# Patient Record
Sex: Male | Born: 1980 | Race: Black or African American | Hispanic: No | Marital: Single | Smoking: Current every day smoker
Health system: Southern US, Community
[De-identification: ages and names within clinical notes are randomized; demographics above are authoritative.]

---

## 2017-09-18 ENCOUNTER — Encounter (HOSPITAL_BASED_OUTPATIENT_CLINIC_OR_DEPARTMENT_OTHER): Payer: Self-pay | Admitting: Emergency Medicine

## 2017-09-18 ENCOUNTER — Other Ambulatory Visit: Payer: Self-pay

## 2017-09-18 ENCOUNTER — Emergency Department (HOSPITAL_BASED_OUTPATIENT_CLINIC_OR_DEPARTMENT_OTHER)
Admission: EM | Admit: 2017-09-18 | Discharge: 2017-09-18 | Disposition: A | Discharge status: Home / Self Care | Payer: Managed Care, Other (non HMO) | Attending: Emergency Medicine | Admitting: Emergency Medicine

## 2017-09-18 ENCOUNTER — Emergency Department (HOSPITAL_BASED_OUTPATIENT_CLINIC_OR_DEPARTMENT_OTHER): Payer: Managed Care, Other (non HMO)

## 2017-09-18 DIAGNOSIS — N50811 Right testicular pain: Secondary | ICD-10-CM | POA: Diagnosis not present

## 2017-09-18 DIAGNOSIS — F17200 Nicotine dependence, unspecified, uncomplicated: Secondary | ICD-10-CM | POA: Diagnosis not present

## 2017-09-18 DIAGNOSIS — N50819 Testicular pain, unspecified: Secondary | ICD-10-CM

## 2017-09-18 LAB — URINALYSIS, ROUTINE W REFLEX MICROSCOPIC
BILIRUBIN URINE: NEGATIVE
GLUCOSE, UA: NEGATIVE mg/dL
KETONES UR: 15 mg/dL — AB
LEUKOCYTES UA: NEGATIVE
Nitrite: NEGATIVE
PH: 6 (ref 5.0–8.0)
Protein, ur: NEGATIVE mg/dL
Specific Gravity, Urine: >1.03 — ABNORMAL HIGH (ref 1.005–1.030)

## 2017-09-18 LAB — URINALYSIS, MICROSCOPIC (REFLEX)

## 2017-09-18 LAB — CBG MONITORING, ED: Glucose-Capillary: 79 mg/dL (ref 65–99)

## 2017-09-18 NOTE — ED Provider Notes (Signed)
[facility] EMERGENCY DEPARTMENT Provider Note   CSN: [id number] Arrival date & time: 09/18/17  1747     History   Chief Complaint Chief Complaint  Patient presents with  . Groin Pain    HPI Gary Valdez is a 36 y.o. male.  36 yo M with a chief complaint of right testicle pain.  This has resolved.  Lasted for about 8 hours.  Denies nausea or vomiting.  Described the pain is mild.  Points to an area just above his testicle as the area of pain.  Has had some right side pain over the past couple days.  This is episodic.  Seems to have been worse when he tries to get up out of bed.  Denies trauma.  Denies fevers or chills.  Denies nausea or vomiting.   The history is provided by the patient.  Groin Pain  This is a new problem. The current episode started yesterday. The problem occurs constantly. The problem has not changed since onset.Pertinent negatives include no chest pain, no abdominal pain, no headaches and no shortness of breath. Nothing aggravates the symptoms. Nothing relieves the symptoms. He has tried nothing for the symptoms. The treatment provided no relief.    History reviewed. No pertinent past medical history.  There are no active problems to display for this patient.   History reviewed. No pertinent surgical history.     Home Medications    Prior to Admission medications   Not on File    Family History History reviewed. No pertinent family history.  Social History Social History   Tobacco Use  . Smoking status: Current Every Day Smoker  . Smokeless tobacco: Never Used  Substance Use Topics  . Alcohol use: No    Frequency: Never  . Drug use: No     Allergies   Patient has no known allergies.   Review of Systems Review of Systems  Constitutional: Negative for chills and fever.  HENT: Negative for congestion and facial swelling.   Eyes: Negative for discharge and visual disturbance.  Respiratory: Negative for shortness of  breath.   Cardiovascular: Negative for chest pain and palpitations.  Gastrointestinal: Negative for abdominal pain, diarrhea and vomiting.  Genitourinary: Positive for flank pain and hematuria.  Musculoskeletal: Negative for arthralgias and myalgias.  Skin: Negative for color change and rash.  Neurological: Negative for tremors, syncope and headaches.  Psychiatric/Behavioral: Negative for confusion and dysphoric mood.     Physical Exam Updated Vital Signs BP (!) 143/88 (BP Location: Right Arm)   Pulse 88   Temp 97.8 F (36.6 C) (Oral)   Resp 18   Ht 6' (1.829 m)   Wt (!) 178.7 kg (394 lb)   SpO2 96%   BMI 53.44 kg/m   Physical Exam  Constitutional: He is oriented to person, place, and time. He appears well-developed and well-nourished.  HENT:  Head: Normocephalic and atraumatic.  Eyes: EOM are normal. Pupils are equal, round, and reactive to light.  Neck: Normal range of motion. Neck supple. No JVD present.  Cardiovascular: Normal rate and regular rhythm. Exam reveals no gallop and no friction rub.  No murmur heard. Pulmonary/Chest: No respiratory distress. He has no wheezes.  Abdominal: He exhibits no distension. There is no rebound and no guarding.  Genitourinary: Testes normal. Right testis shows no mass, no swelling and no tenderness. Left testis shows no mass, no swelling and no tenderness. Circumcised.  Musculoskeletal: Normal range of motion.  Neurological: He is alert and oriented  to person, place, and time.  Skin: No rash noted. No pallor.  Psychiatric: He has a normal mood and affect. His behavior is normal.  Nursing note and vitals reviewed.    ED Treatments / Results  Labs (all labs ordered are listed, but only abnormal results are displayed) Labs Reviewed  URINALYSIS, ROUTINE W REFLEX MICROSCOPIC - Abnormal; Notable for the following components:      Result Value   Specific Gravity, Urine >1.030 (*)    Hgb urine dipstick LARGE (*)    Ketones, ur 15 (*)     All other components within normal limits  URINALYSIS, MICROSCOPIC (REFLEX) - Abnormal; Notable for the following components:   Bacteria, UA RARE (*)    Squamous Epithelial / LPF 0-5 (*)    All other components within normal limits  CBG MONITORING, ED    EKG  EKG Interpretation None       Radiology US Scrotum  Result Date: 09/18/2017 CLINICAL DATA:  Right groin pain for several hours EXAM: SCROTAL ULTRASOUND DOPPLER ULTRASOUND OF THE TESTICLES TECHNIQUE: Complete ultrasound examination of the testicles, epididymis, and other scrotal structures was performed. Color and spectral Doppler ultrasound were also utilized to evaluate blood flow to the testicles. COMPARISON:  None. FINDINGS: Right testicle Measurements: 4 x 2.1 x 2.4 cm. No mass or microlithiasis visualized. Left testicle Measurements: 3.8 x 2.4 x 2.5 cm. No mass or microlithiasis visualized. Right epididymis: Normal in size. Small cystic area near the epididymal head may reflect small epididymal cyst. Left epididymis:  Normal in size and appearance. Hydrocele:  No significant hydrocele. Varicocele:  None visualized. Pulsed Doppler interrogation of both testes demonstrates normal low resistance arterial and venous waveforms bilaterally. IMPRESSION: 1. Negative for testicular torsion or testicular mass 2. Probable small epididymal cyst Electronically Signed   By: Jasmine Pang M.D.   On: 09/18/2017 21:23   US Renal  Result Date: 09/18/2017 CLINICAL DATA:  Lower back and right groin pain. Frequent urination. EXAM: RENAL / URINARY TRACT ULTRASOUND COMPLETE COMPARISON:  None. FINDINGS: Right Kidney: Length: 11 cm. Echogenicity within normal limits. No mass or hydronephrosis visualized. Left Kidney: Length: 12 cm. Echogenicity within normal limits. No mass or hydronephrosis visualized. Bladder: Limited distention of the bladder with no pathologic finding. Echogenic liver, probable hepatic steatosis. IMPRESSION: 1. Normal renal  ultrasound. 2. Hepatic steatosis. Electronically Signed   By: Marnee Spring M.D.   On: 09/18/2017 21:21   [place] Art/ven Flow Abd Pelv Doppler  Result Date: 09/18/2017 CLINICAL DATA:  Right groin pain for several hours EXAM: SCROTAL ULTRASOUND DOPPLER ULTRASOUND OF THE TESTICLES TECHNIQUE: Complete ultrasound examination of the testicles, epididymis, and other scrotal structures was performed. Color and spectral Doppler ultrasound were also utilized to evaluate blood flow to the testicles. COMPARISON:  None. FINDINGS: Right testicle Measurements: 4 x 2.1 x 2.4 cm. No mass or microlithiasis visualized. Left testicle Measurements: 3.8 x 2.4 x 2.5 cm. No mass or microlithiasis visualized. Right epididymis: Normal in size. Small cystic area near the epididymal head may reflect small epididymal cyst. Left epididymis:  Normal in size and appearance. Hydrocele:  No significant hydrocele. Varicocele:  None visualized. Pulsed Doppler interrogation of both testes demonstrates normal low resistance arterial and venous waveforms bilaterally. IMPRESSION: 1. Negative for testicular torsion or testicular mass 2. Probable small epididymal cyst Electronically Signed   By: Jasmine Pang M.D.   On: 09/18/2017 21:23    Procedures Procedures (including critical care time)  Medications Ordered in ED Medications - No  data to display   Initial Impression / Assessment and Plan / ED Course  I have reviewed the triage vital signs and the nursing notes.  Pertinent labs & imaging results that were available during my care of the patient were reviewed by me and considered in my medical decision making (see chart for details).     36 yo M with a chief complaint of about 8 hours of right testicle pain.  This actually has resolved over the past couple hours.  He was seen at urgent care and sent for a ultrasound.  He is also had some right flank pain.  Was noted to have blood in his urine.  Will obtain a ultrasound of his scrotum  as well as 1 of his kidneys.  US negative. Given urology follow up for hematuria. D/c home.   11:18 PM:  I have discussed the diagnosis/risks/treatment options with the patient and family and believe the pt to be eligible for discharge home to follow-up with PCP, urology. We also discussed returning to the ED immediately if new or worsening sx occur. We discussed the sx which are most concerning (e.g., sudden worsening pain, fever, inability to tolerate by mouth) that necessitate immediate return. Medications administered to the patient during their visit and any new prescriptions provided to the patient are listed below.  Medications given during this visit Medications - No data to display   The patient appears reasonably screen and/or stabilized for discharge and I doubt any other medical condition or other [facility] requiring further screening, evaluation, or treatment in the ED at this time prior to discharge.    Final Clinical Impressions(s) / ED Diagnoses   Final diagnoses:  Testicle pain    ED Discharge Orders    None       Melene PlanFloyd, Mamoru Takeshita, DO 09/18/17 2318

## 2017-09-18 NOTE — ED Triage Notes (Signed)
Patient states that he was seen at [facility] and sent here because he has had groin pain x 2 -3 days. The patient has increased urination. The patient reports that he also was noted to have blood in his urine at [facility]. Patient states that pain is worse with walking

## 2017-09-18 NOTE — Discharge Instructions (Signed)
Follow up with the urologist as needed.  Follow up with your PCP. Wear tight fitting underwear.  Your US of your kidney was normal.

## 2017-09-18 NOTE — ED Notes (Signed)
Pt verbalizes understanding of d/c instructions and denies any further needs at this time. 

## 2018-05-02 IMAGING — US US RENAL
1 series · 14 of 21 positions shown · non-contrast
Comparison: None.

CLINICAL DATA: Lower back and right groin pain. Frequent urination.

EXAM:
RENAL / URINARY TRACT ULTRASOUND COMPLETE

[Series 1: us renal · 0.31mm/px · 14 of 21 slices shown]
[im 1/21]
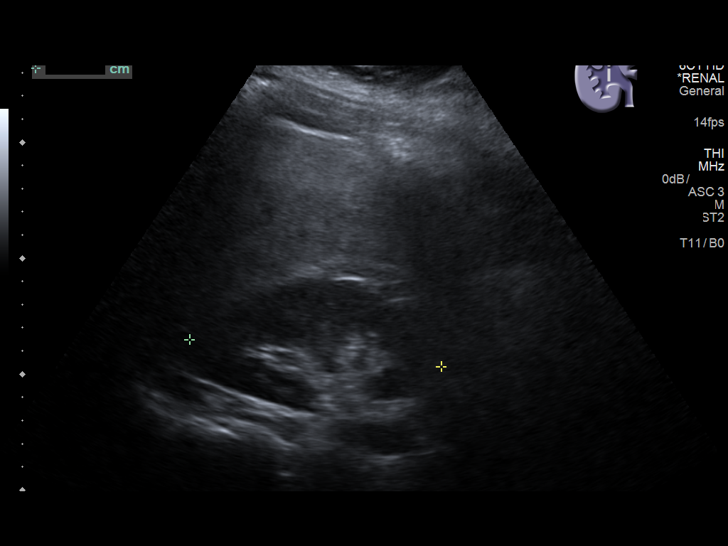
[im 3/21]
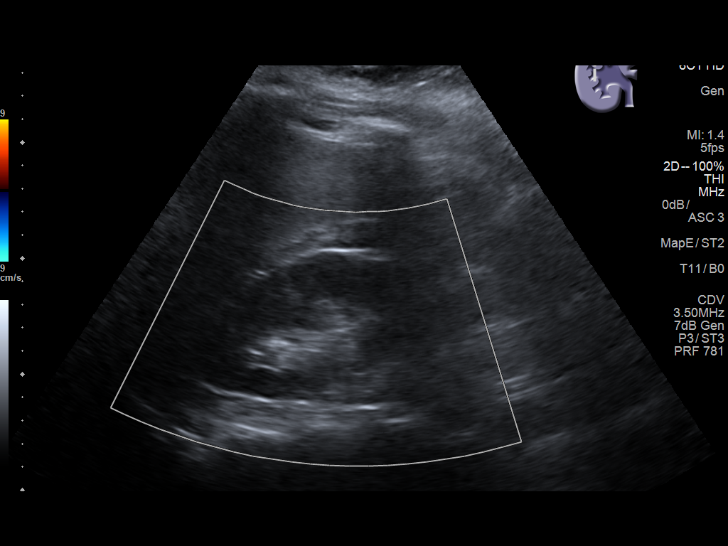
[im 4/21]
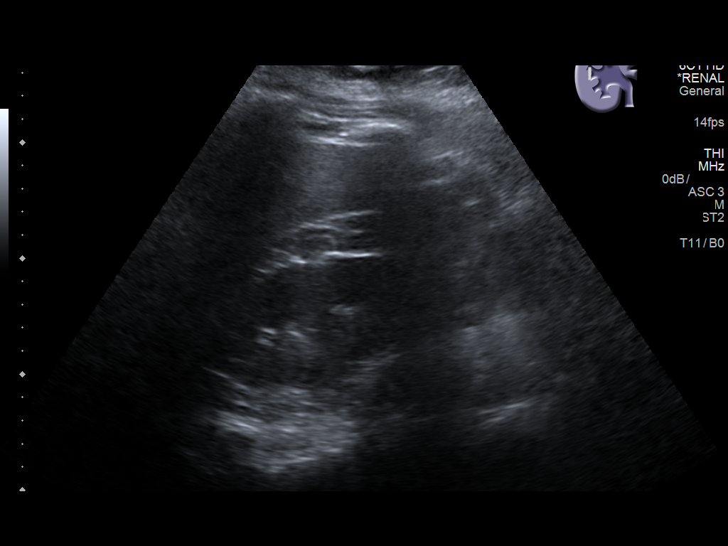
[im 6/21]
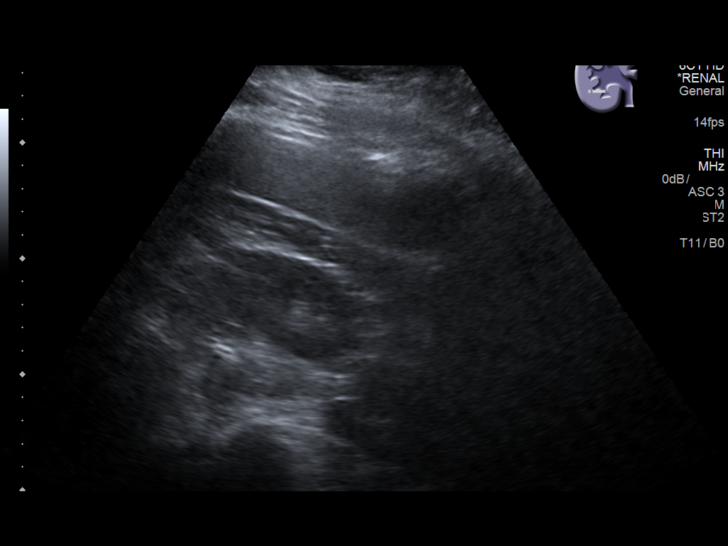
[im 7/21]
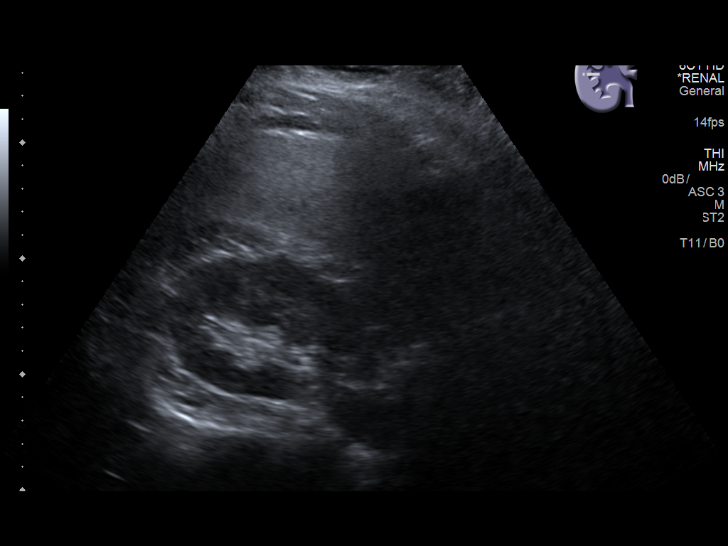
[im 9/21]
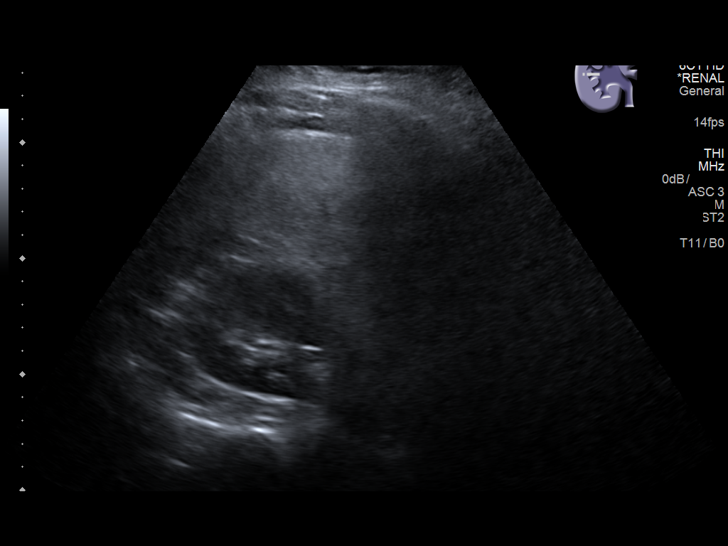
[im 10/21]
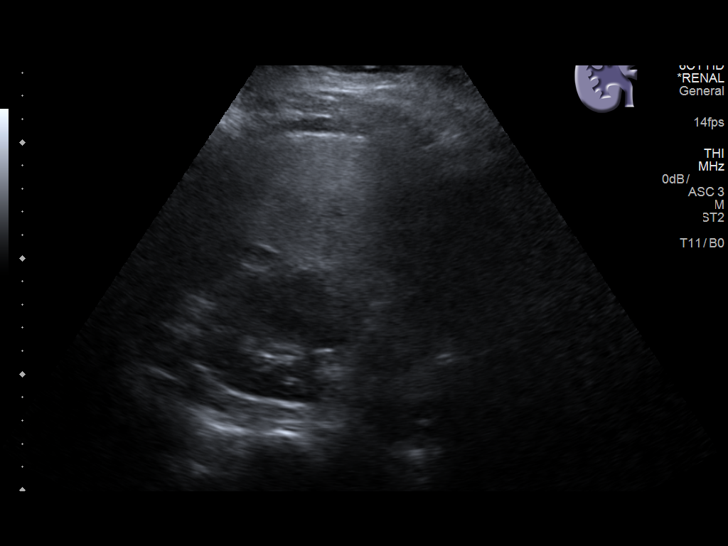
[im 12/21]
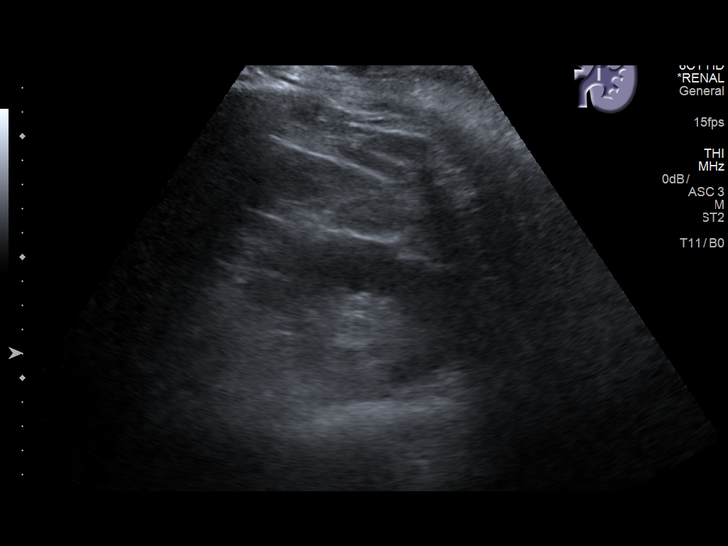
[im 13/21]
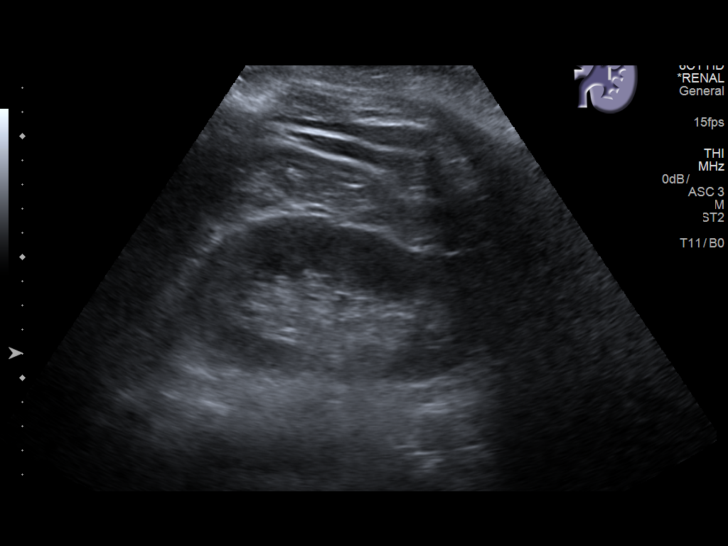
[im 15/21]
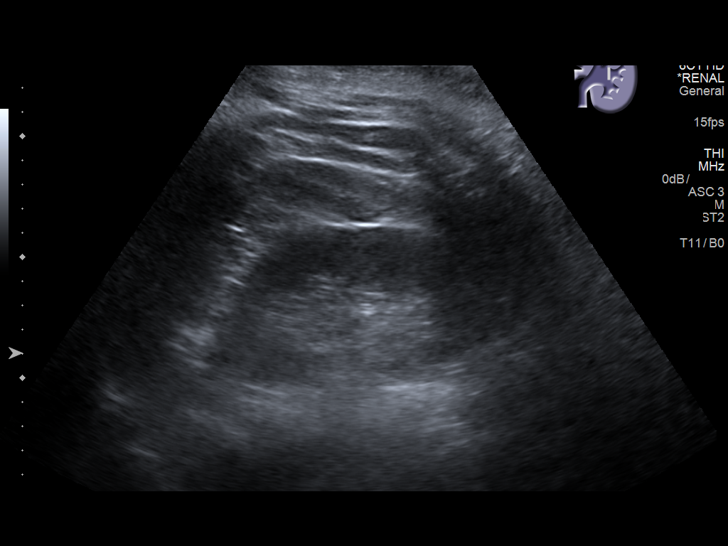
[im 16/21]
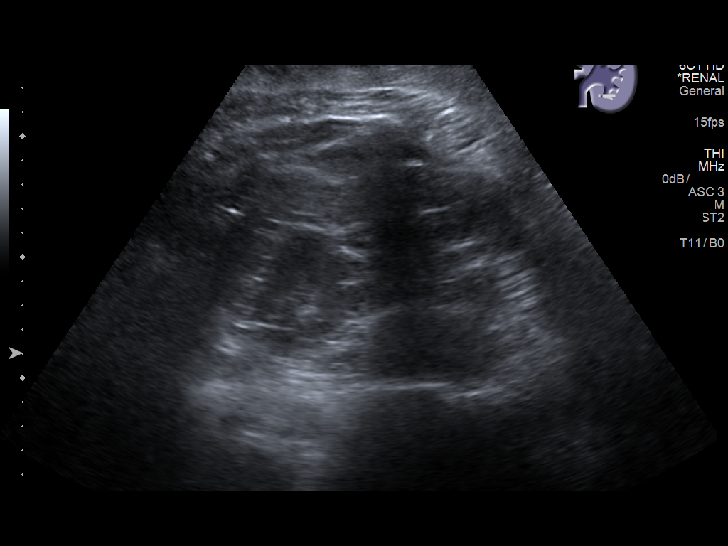
[im 18/21]
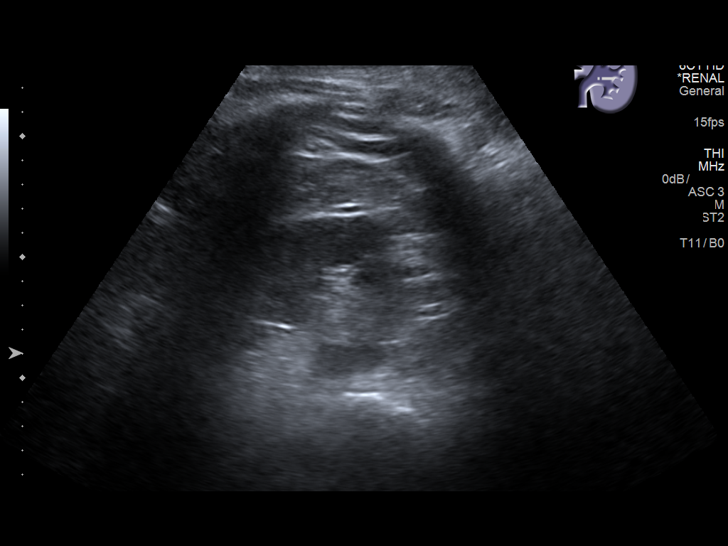
[im 19/21]
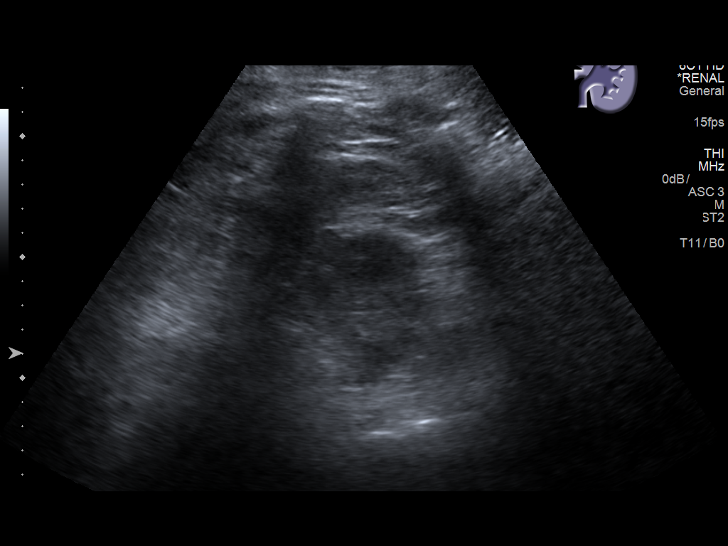
[im 21/21]
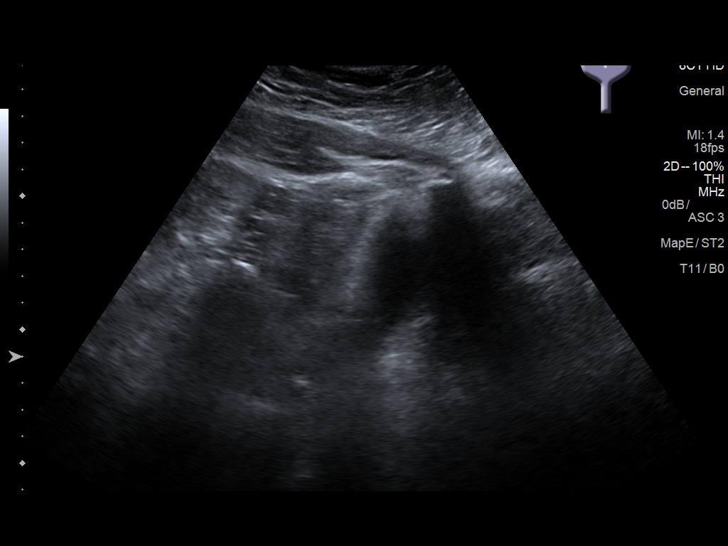

[14 of 21 positions shown; findings below may reference images not displayed]

FINDINGS: Right Kidney:

Length: 11 cm. Echogenicity within normal limits. No mass or
hydronephrosis visualized.

Left Kidney:

Length: 12 cm. Echogenicity within normal limits. No mass or
hydronephrosis visualized.

Bladder:

Limited distention of the bladder with no pathologic finding.

Echogenic liver, probable hepatic steatosis.
IMPRESSION: 1. Normal renal ultrasound.
2. Hepatic steatosis.

## 2018-05-02 IMAGING — US US SCROTUM
1 series · 14 of 25 positions shown · non-contrast
Comparison: None.

CLINICAL DATA: Right groin pain for several hours

EXAM:
SCROTAL ULTRASOUND
DOPPLER ULTRASOUND OF THE TESTICLES
TECHNIQUE: Complete ultrasound examination of the testicles, epididymis, and
other scrotal structures was performed. Color and spectral Doppler
ultrasound were also utilized to evaluate blood flow to the
testicles.

[Series 1: us scrotum · 0.07mm/px · 14 of 30 slices shown]
[im 1/30]
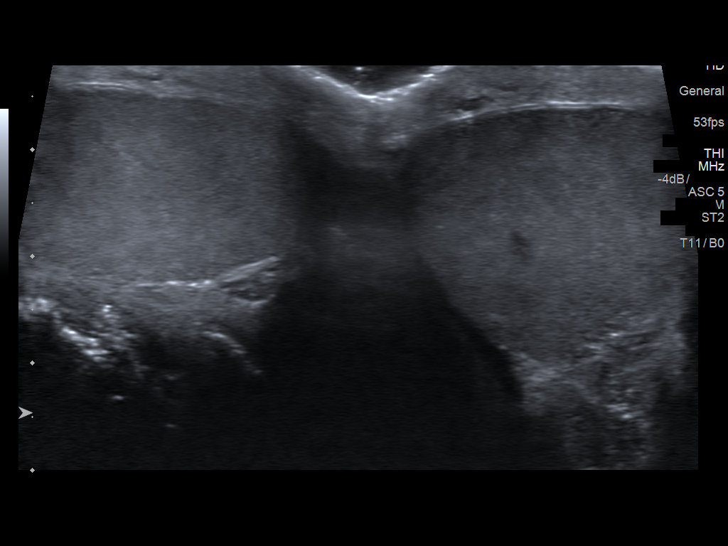
[im 3/30]
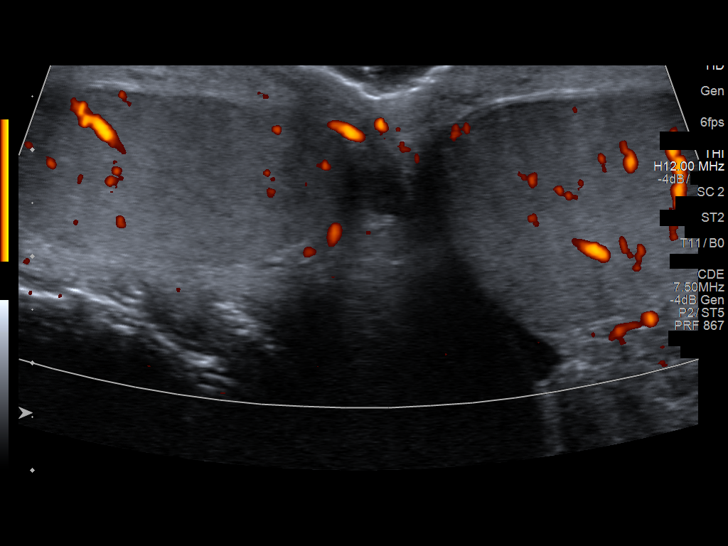
[im 5/30]
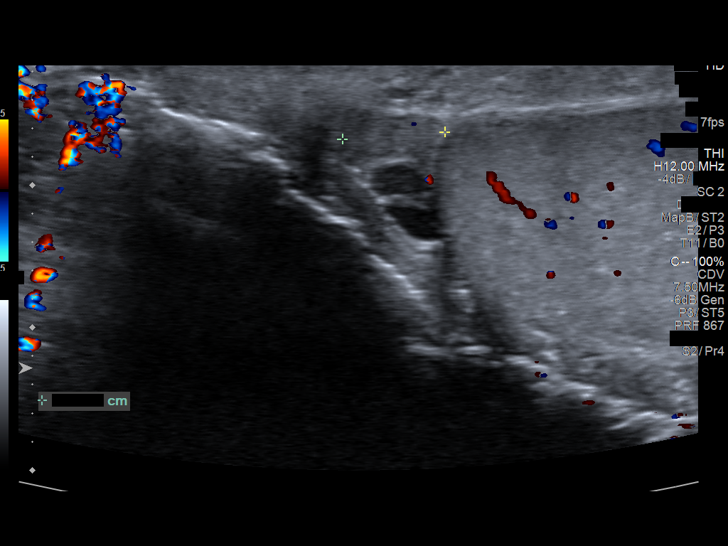
[im 8/30]
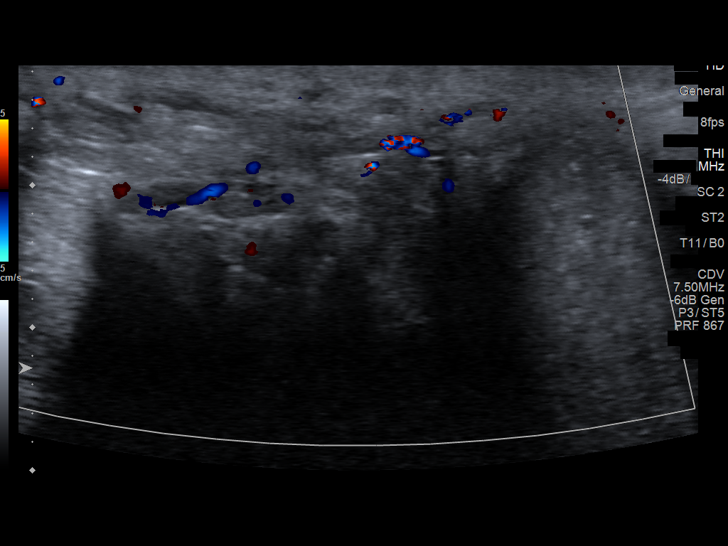
[im 10/30]
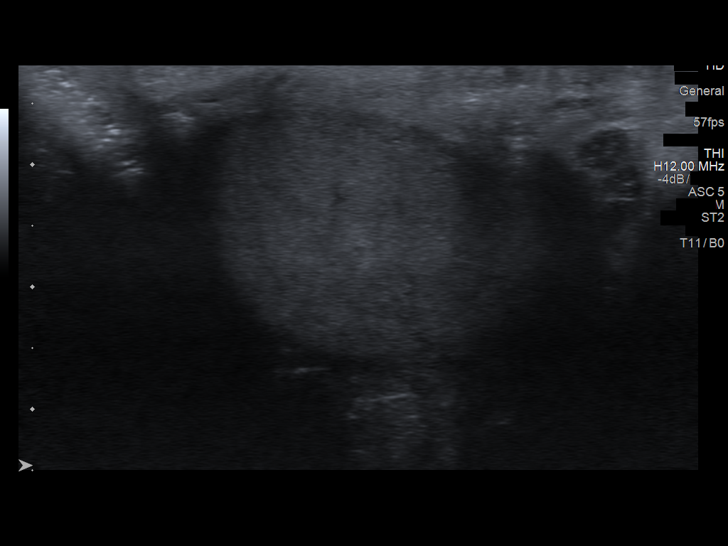
[im 11/30]
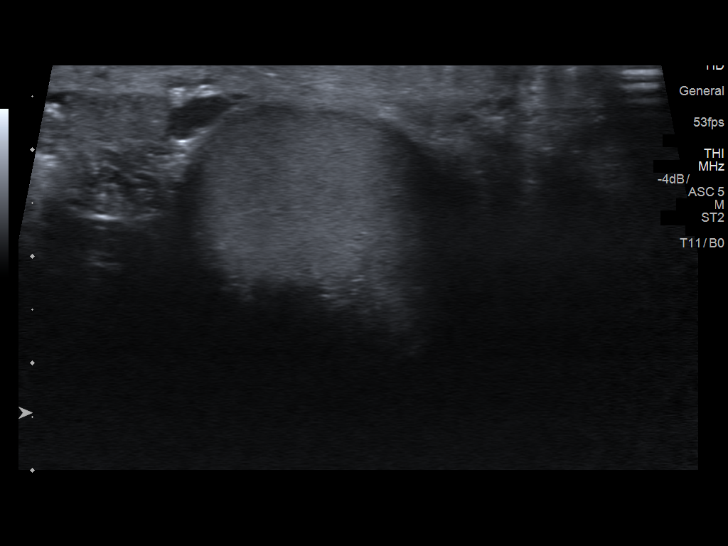
[im 14/30]
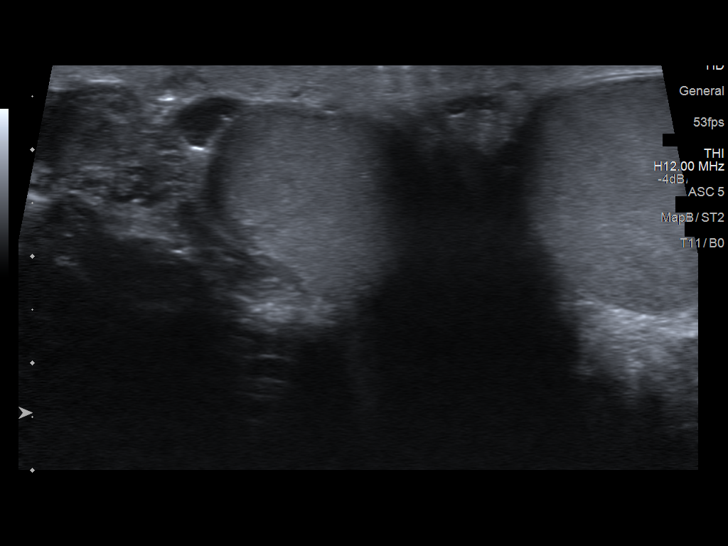
[im 16/30]
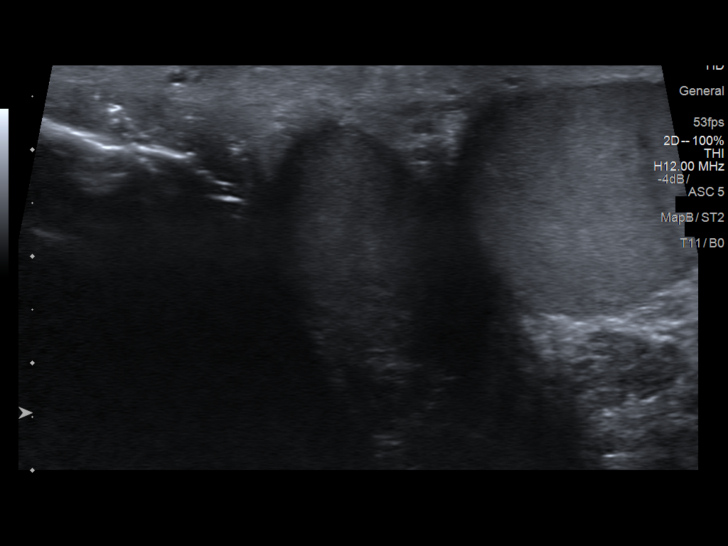
[im 19/30]
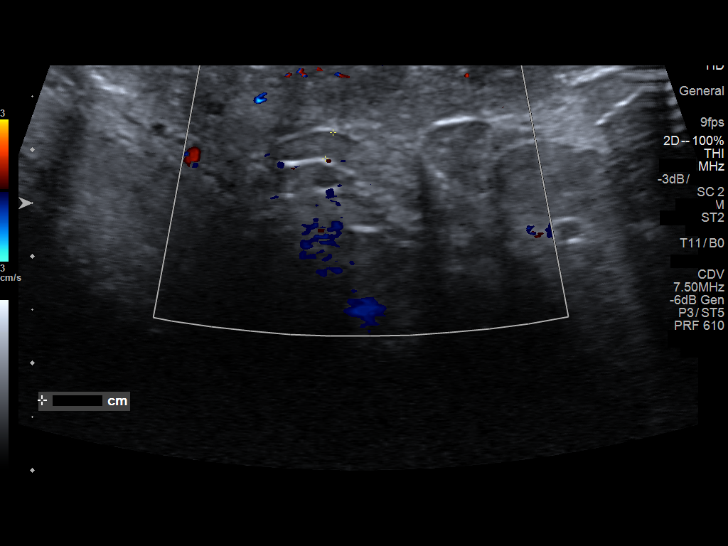
[im 20/30]
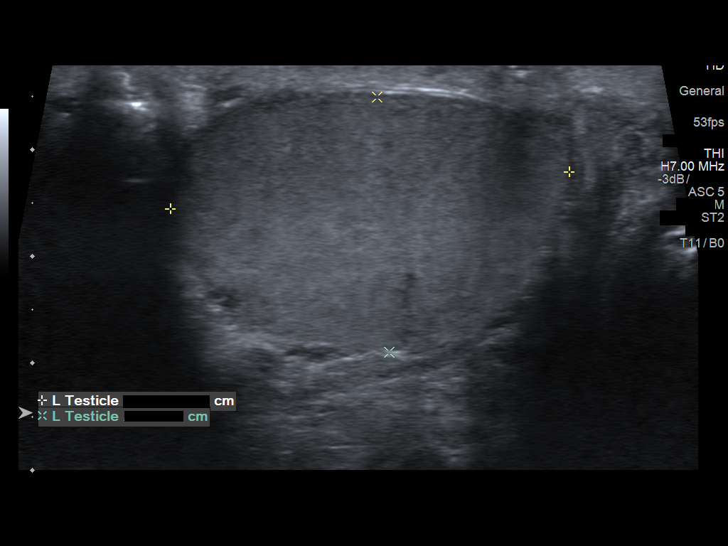
[im 22/30]
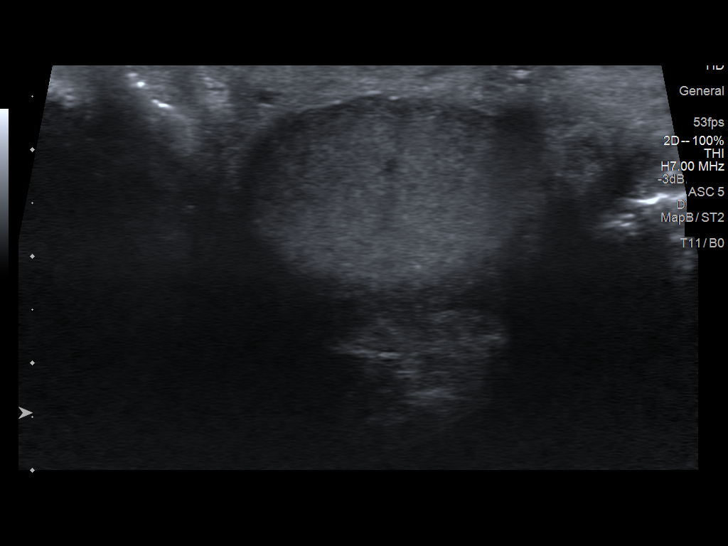
[im 25/30]
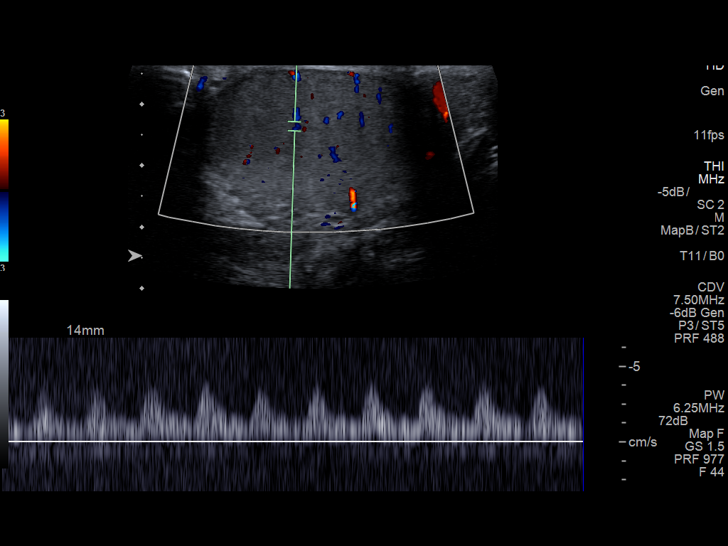
[im 27/30]
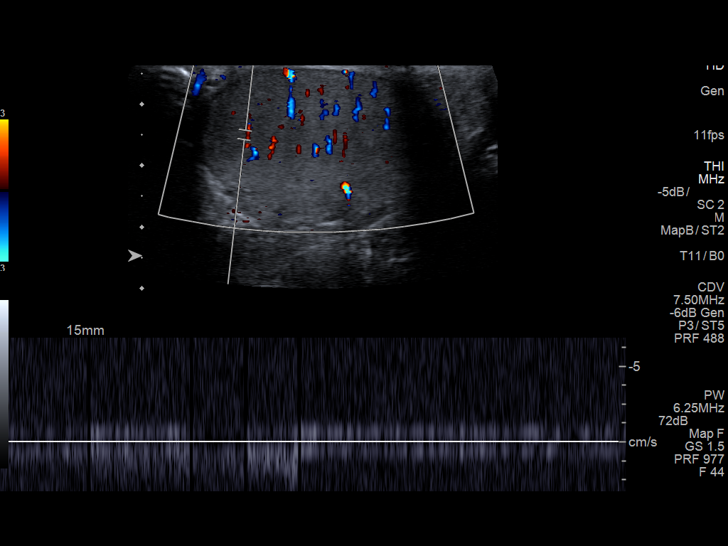
[im 30/30]
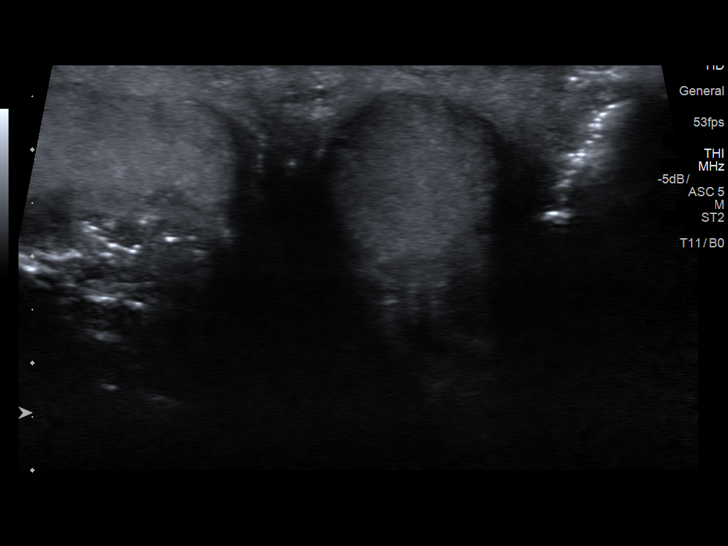

[14 of 25 positions shown; findings below may reference images not displayed]

FINDINGS: Right testicle

Measurements: 4 x 2.1 x 2.4 cm. No mass or microlithiasis
visualized.

Left testicle

Measurements: 3.8 x 2.4 x 2.5 cm. No mass or microlithiasis
visualized.

Right epididymis: Normal in size. Small cystic area near the
epididymal head may reflect small epididymal cyst.

Left epididymis:  Normal in size and appearance.

Hydrocele:  No significant hydrocele.

Varicocele:  None visualized.

Pulsed Doppler interrogation of both testes demonstrates normal low
resistance arterial and venous waveforms bilaterally.
IMPRESSION: 1. Negative for testicular torsion or testicular mass
2. Probable small epididymal cyst

## 2019-08-27 ENCOUNTER — Telehealth: Payer: Self-pay | Admitting: Orthopaedic Surgery

## 2019-08-27 NOTE — Telephone Encounter (Signed)
Not sure this is the right patient? It doesn't look like he has ever been seen before?

## 2019-08-27 NOTE — Telephone Encounter (Signed)
Patient called needing Rx refilled for gout. Patient said he do not remember the name of the medication. The number to contact patient is [phone]
